# Patient Record
Sex: Male | Born: 1955 | Race: White | Hispanic: No | Marital: Married | Smoking: Former smoker
Health system: Southern US, Community
[De-identification: ages and names within clinical notes are randomized; demographics above are authoritative.]

## PROBLEM LIST (undated history)

## (undated) DIAGNOSIS — I499 Cardiac arrhythmia, unspecified: Secondary | ICD-10-CM

## (undated) DIAGNOSIS — C4491 Basal cell carcinoma of skin, unspecified: Secondary | ICD-10-CM

## (undated) DIAGNOSIS — K219 Gastro-esophageal reflux disease without esophagitis: Secondary | ICD-10-CM

## (undated) DIAGNOSIS — Z87442 Personal history of urinary calculi: Secondary | ICD-10-CM

## (undated) DIAGNOSIS — M1990 Unspecified osteoarthritis, unspecified site: Secondary | ICD-10-CM

## (undated) DIAGNOSIS — L409 Psoriasis, unspecified: Secondary | ICD-10-CM

## (undated) HISTORY — PX: SKIN CANCER EXCISION: SHX779

## (undated) HISTORY — PX: KNEE SURGERY: SHX244

## (undated) HISTORY — PX: WRIST SURGERY: SHX841

## (undated) HISTORY — PX: HERNIA REPAIR: SHX51

## (undated) HISTORY — DX: Cardiac arrhythmia, unspecified: I49.9

---

## 2006-07-08 ENCOUNTER — Encounter: Admission: RE | Admit: 2006-07-08 | Discharge: 2006-07-08 | Payer: Self-pay | Admitting: Internal Medicine

## 2006-08-18 ENCOUNTER — Encounter: Admission: RE | Admit: 2006-08-18 | Discharge: 2006-08-18 | Payer: Self-pay | Admitting: Gastroenterology

## 2007-06-20 IMAGING — CT CT PELVIS W/ CM
2 of 5 series · 17 of 46 positions shown, 19 images · IV contrast (READICAT/WATER & [ID] OMNI 300)
Comparison: None.

ABDOMEN CT WITH CONTRAST:

CLINICAL DATA: Lower abdominal pain and diarrhea.
TECHNIQUE: Multidetector CT imaging of the abdomen and pelvis was performed
following the standard protocol during bolus administration of intravenous
contrast.

Contrast:  125 cc Omnipaque 300

[Series 3: routine abdomen · axial · 0.70mm/px · z∈[-400,-40]mm · 14 of 81 slices shown, 16 images]
[im 5/81  soft-tissue]
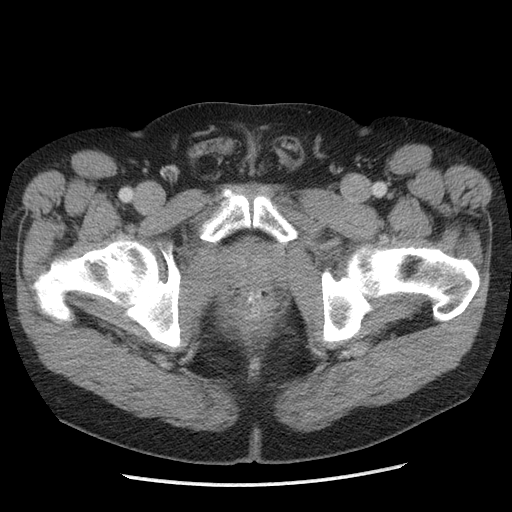
[im 5/81  bone]
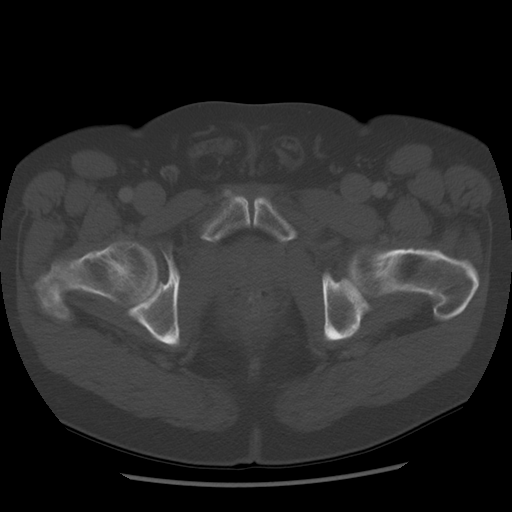
[im 9/81  soft-tissue]
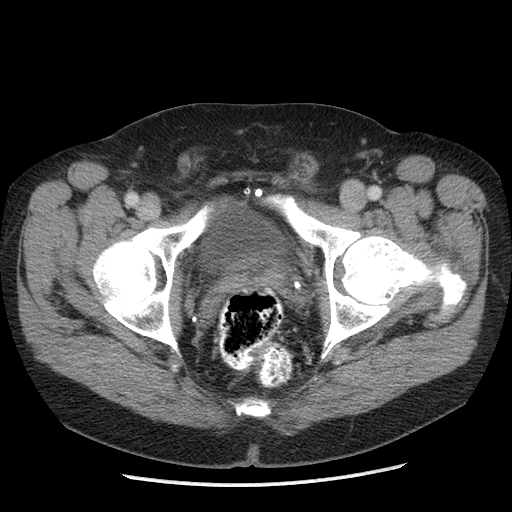
[im 17/81  soft-tissue]
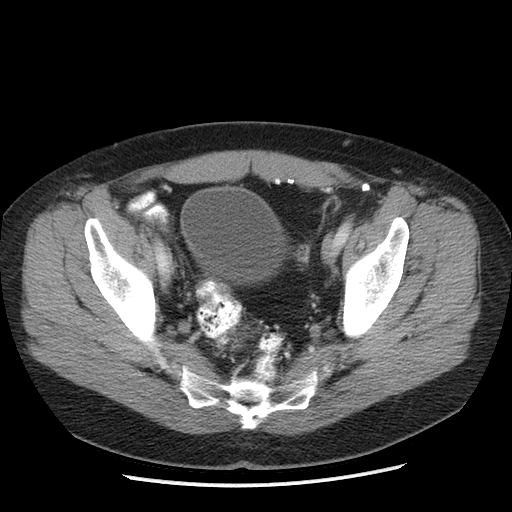
[im 22/81  soft-tissue]
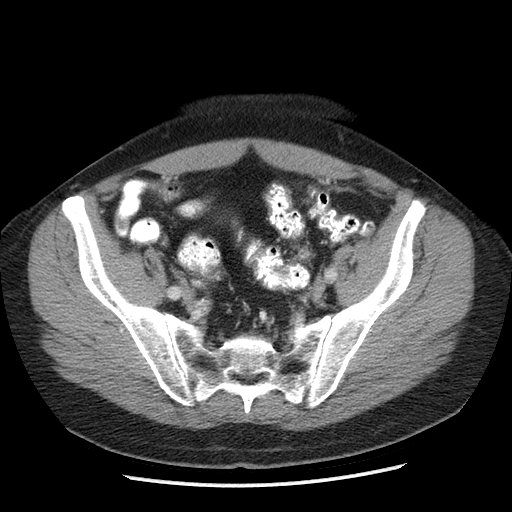
[im 26/81  soft-tissue]
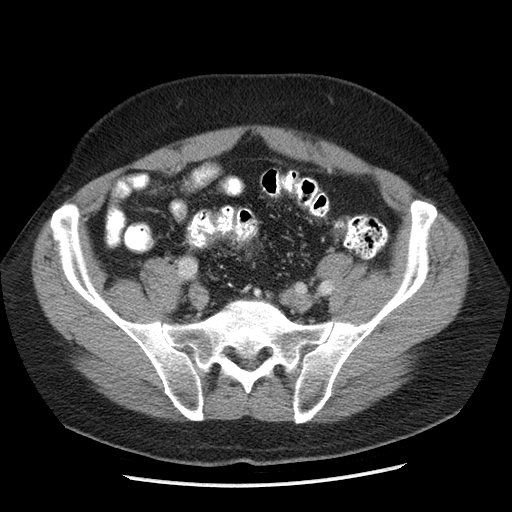
[im 34/81  soft-tissue]
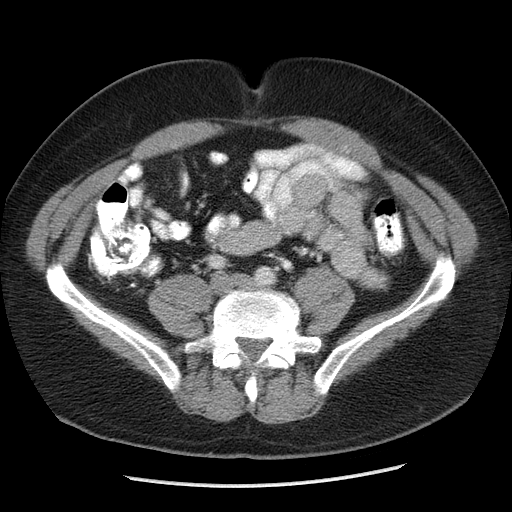
[im 38/81  soft-tissue]
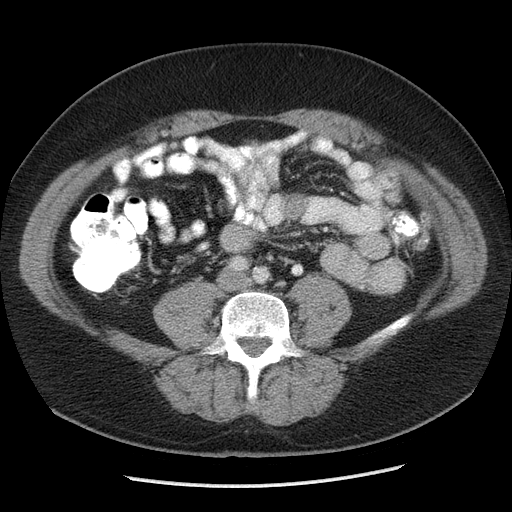
[im 43/81  soft-tissue]
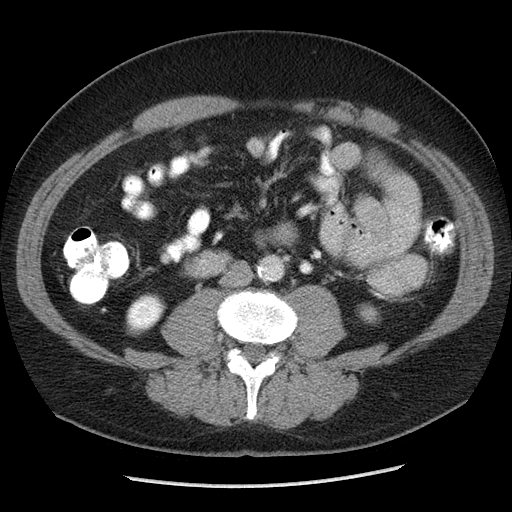
[im 47/81  soft-tissue]
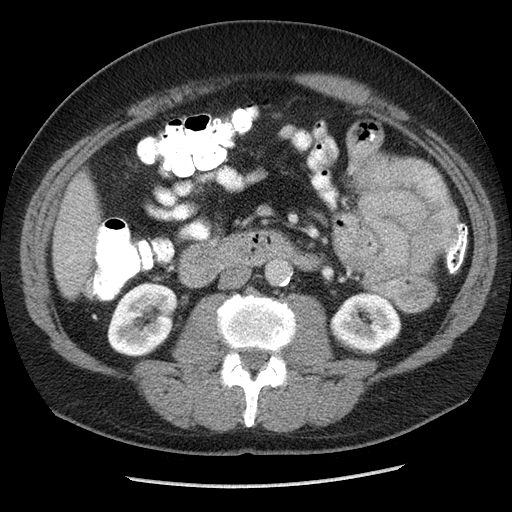
[im 47/81  bone]
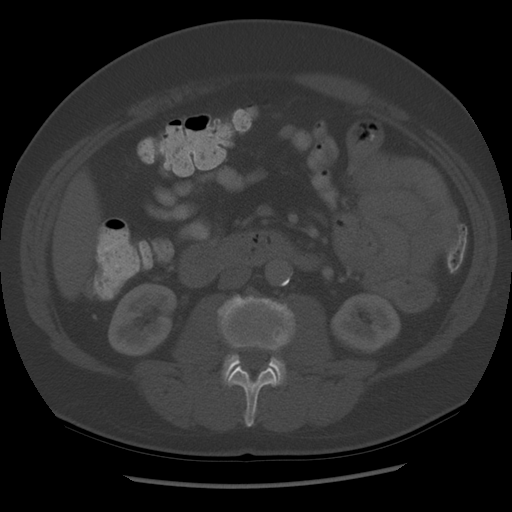
[im 55/81  soft-tissue]
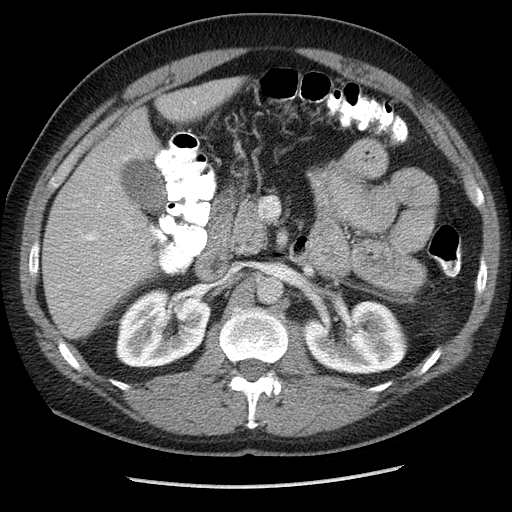
[im 59/81  soft-tissue]
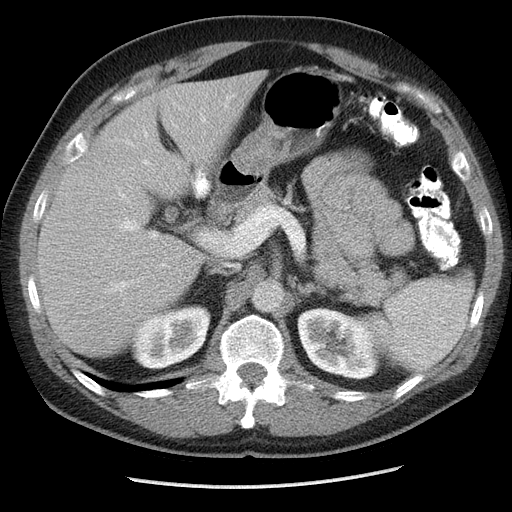
[im 64/81  soft-tissue]
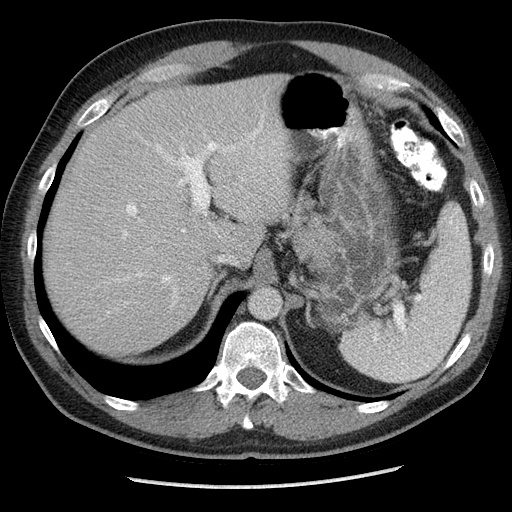
[im 72/81  soft-tissue]
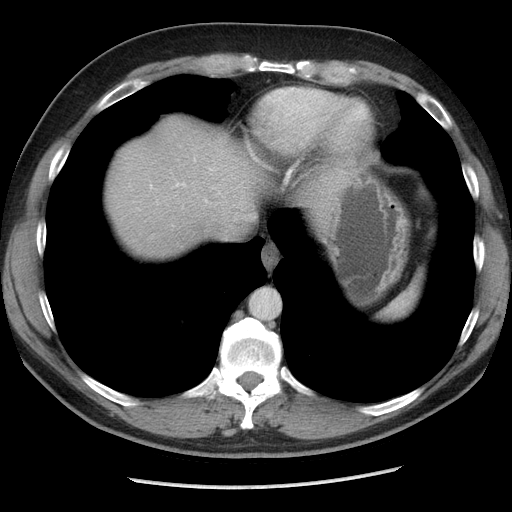
[im 76/81  soft-tissue]
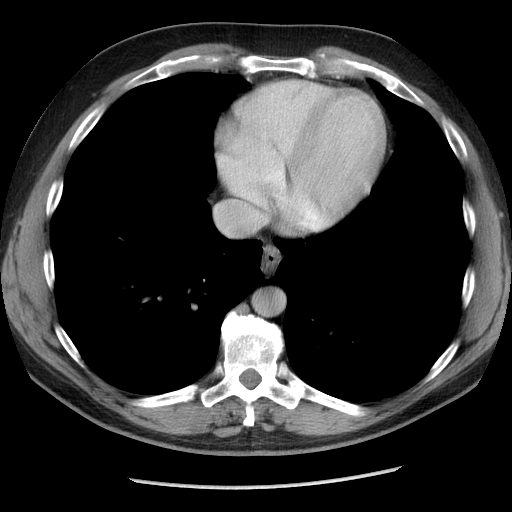

[Series 602: sagittal body · sagittal · 0.88mm/px · 3 of 145 slices shown]
[im 49/145  soft-tissue]
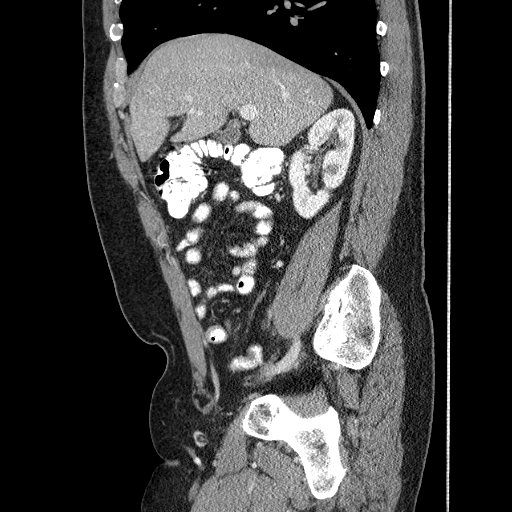
[im 65/145  soft-tissue]
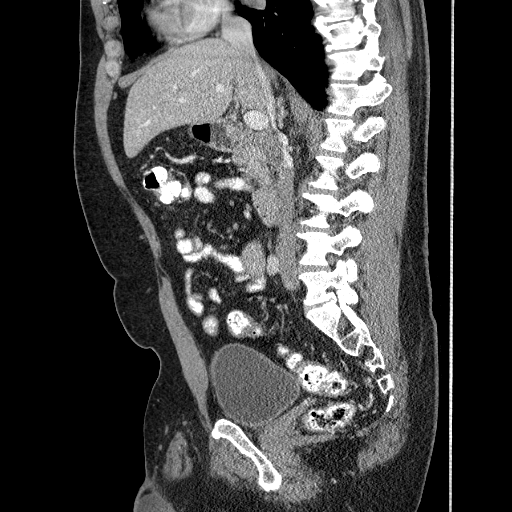
[im 81/145  soft-tissue]
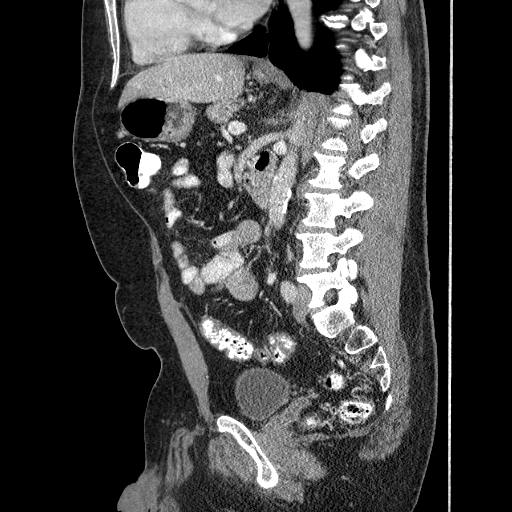

[17 of 46 positions shown; findings below may reference images not displayed]

FINDINGS: The liver, spleen, stomach, duodenum, pancreas, gallbladder, adrenal
glands, and kidneys are unremarkable. No intraperitoneal free fluid. No
abdominal lymphadenopathy. No abdominal aortic aneurysm.
IMPRESSION: No acute findings in the abdomen.

PELVIS CT WITH CONTRAST:
FINDINGS: No evidence for lymphadenopathy or intraperitoneal free fluid.
Patient is status post mesh placement in the left inguinal region. No evidence
for hiatal hernia. The bladder is not distended. Terminal ileum is unremarkable.
The appendix is unremarkable. No evidence for diverticulitis or colonic wall
thickening. No fluid identified within the colon to suggest ongoing diarrhea.
IMPRESSION: No acute findings in the anatomic pelvis.

## 2007-07-03 ENCOUNTER — Ambulatory Visit: Payer: Self-pay | Admitting: Family Medicine

## 2008-09-15 ENCOUNTER — Ambulatory Visit: Payer: Self-pay | Admitting: Family Medicine

## 2014-12-12 ENCOUNTER — Other Ambulatory Visit: Payer: Self-pay | Admitting: Family Medicine

## 2014-12-12 DIAGNOSIS — M7989 Other specified soft tissue disorders: Secondary | ICD-10-CM

## 2014-12-14 ENCOUNTER — Ambulatory Visit
Admission: RE | Admit: 2014-12-14 | Discharge: 2014-12-14 | Disposition: A | Discharge status: Home / Self Care | Payer: BC Managed Care – PPO | Source: Ambulatory Visit | Attending: Family Medicine | Admitting: Family Medicine

## 2014-12-14 DIAGNOSIS — M7989 Other specified soft tissue disorders: Secondary | ICD-10-CM

## 2017-05-09 ENCOUNTER — Ambulatory Visit: Payer: BC Managed Care – PPO | Admitting: Family Medicine

## 2021-06-07 ENCOUNTER — Other Ambulatory Visit: Payer: Self-pay

## 2021-06-07 ENCOUNTER — Ambulatory Visit: Payer: Medicare Other | Admitting: Cardiology

## 2021-06-07 ENCOUNTER — Encounter: Payer: Self-pay | Admitting: Cardiology

## 2021-06-07 VITALS — BP 124/70 | HR 62 | Temp 98.0°F | Resp 14 | Ht 72.0 in | Wt 239.0 lb

## 2021-06-07 DIAGNOSIS — Z6832 Body mass index (BMI) 32.0-32.9, adult: Secondary | ICD-10-CM

## 2021-06-07 DIAGNOSIS — E6609 Other obesity due to excess calories: Secondary | ICD-10-CM

## 2021-06-07 DIAGNOSIS — I4821 Permanent atrial fibrillation: Secondary | ICD-10-CM

## 2021-06-07 NOTE — Progress Notes (Signed)
Primary Physician/Referring:  Chesley Noon, MD  Patient ID: Mathew Daugherty, male    DOB: 27-Sep-1955, 66 y.o.   MRN: [id number]  Chief Complaint  Patient presents with   Atrial Fibrillation   New Patient (Initial Visit)    Referred by Anastasia Pall, MD   HPI:    Mathew Daugherty  is a 66 y.o. Caucasian male patient with atrial fibrillation diagnosed during routine physical examination in 2021, has had echocardiogram and also Holter monitor for 48 hours which revealed persistent atrial fibrillation.  He is referred to me for management of A-fib.  He is essentially asymptomatic.   Past Medical History:  Diagnosis Date   Arrhythmia    History reviewed. No pertinent surgical history. Family History  Problem Relation Age of Onset   Stroke Mother 35   Leukemia Father 53   Heart failure Sister        Died from complications of blood clots   Hyperlipidemia Sister    Other Proofreader accident    Social History   Tobacco Use   Smoking status: Former    Packs/day: 0.50    Years: 15.00    Pack years: 7.50    Types: Cigarettes    Quit date: 1998    Years since quitting: 25.1   Smokeless tobacco: Never  Substance Use Topics   Alcohol use: Yes    Alcohol/week: 1.0 standard drink    Types: 1 Glasses of wine per week    Comment: occasionally   Marital Status: Married  ROS  Review of Systems  Cardiovascular:  Negative for chest pain, dyspnea on exertion and leg swelling.  Objective  Blood pressure 124/70, pulse 62, temperature 98 F (36.7 C), temperature source Temporal, resp. rate 14, height 6' (1.829 m), weight 239 lb (108.4 kg), SpO2 98 %. Body mass index is 32.41 kg/m.  Vitals with BMI 06/07/2021  Height 6' 0"   Weight 239 lbs  BMI 46.56  Systolic 812  Diastolic 70  Pulse 62    Physical Exam Constitutional:      Appearance: He is obese.  Neck:     Vascular: No carotid bruit or JVD.  Cardiovascular:     Rate and Rhythm: Normal rate. Rhythm irregular.      Pulses: Normal pulses and intact distal pulses.     Heart sounds: No murmur heard.   No gallop. No S3 or S4 sounds.  Pulmonary:     Effort: Pulmonary effort is normal.     Breath sounds: Normal breath sounds.  Abdominal:     General: Bowel sounds are normal.     Palpations: Abdomen is soft.  Musculoskeletal:     Right lower leg: No edema.     Left lower leg: No edema.  Skin:    Capillary Refill: Capillary refill takes less than 2 seconds.     Medications and allergies  No Known Allergies   Medication list after today's encounter    Current Outpatient Medications:    Cholecalciferol 25 MCG (1000 UT) tablet, Take 1 tablet by mouth daily., Disp: , Rfl:    diclofenac Sodium (VOLTAREN) 1 % GEL, Place onto the skin., Disp: , Rfl:    hydrocortisone 2.5 % lotion, Apply topically daily., Disp: , Rfl:    sildenafil (REVATIO) 20 MG tablet, 1-3 tabs daily prn ED, Disp: , Rfl:    Tapinarof (VTAMA) 1 % CREA, Apply topically., Disp: , Rfl:    triamcinolone cream (KENALOG) 0.1 %,  Apply 1-2 application topically as needed., Disp: , Rfl:   Current Outpatient Medications  Medication Instructions   Cholecalciferol 25 MCG (1000 UT) tablet 1 tablet, Oral, Daily   diclofenac Sodium (VOLTAREN) 1 % GEL Transdermal   hydrocortisone 2.5 % lotion Topical, Daily   sildenafil (REVATIO) 20 MG tablet 1-3 tabs daily prn ED   Tapinarof (VTAMA) 1 % CREA Topical   triamcinolone cream (KENALOG) 0.1 % 1-2 application, Topical, As needed    Laboratory examination:   TSH No results for input(s): TSH in the last 8760 hours.  External labs:   Labs 05/18/2021:  Total cholesterol 141, triglycerides 68, HDL 40, LDL 87.  Vitamin D markedly elevated at 127.  Hb 15.9/HCT 46.4, platelets 148, normal indicis.  Serum glucose 1 1 2  mg, BUN 13, creatinine 1.03, EGFR 80 mL, potassium 4.6, LFTs normal.    Radiology:     Cardiac Studies:   Holter monitor for 48 hours 02/10/2020, external source: Patient  monitored for 48 hours.  Analysis and rhythm strips revealed persistent atrial fibrillation at an average rate of 68 bpm.  Atrial fibrillation burden 100%.  Minimum heart rate 41 bpm.  Maximum heart rate 137 bpm.  Rare premature ventricular contractions.  No complex ventricular ectopy.  There was one 3-second pause noted.  Echocardiogram 02/15/2020:  Normal LV size scratch that Left ventricle is normal in size, mild LVH.  Indeterminate diastolic function.  EF 55 to 60%. Left atrium is mildly dilated. No significant valvular abnormality.  EKG:   EKG 06/07/2021: Atrial fibrillation with controlled ventricular response at rate of 68 bpm, normal axis, incomplete right bundle branch block.  No evidence of ischemia.    Assessment     ICD-10-CM   1. Permanent atrial fibrillation (HCC)  I48.21 EKG 12-Lead    TSH + free T4    PCV ECHOCARDIOGRAM COMPLETE    2. Class 1 obesity due to excess calories without serious comorbidity with body mass index (BMI) of 32.0 to 32.9 in adult  E66.09 TSH + free T4   Z68.32        Medications Discontinued During This Encounter  Medication Reason   aspirin 81 MG EC tablet Discontinued by provider    No orders of the defined types were placed in this encounter.  Orders Placed This Encounter  Procedures   TSH + free T4   EKG 12-Lead   PCV ECHOCARDIOGRAM COMPLETE    Standing Status:   Future    Standing Expiration Date:   06/07/2022   Recommendations:   Mathew Daugherty is a 66 y.o. Caucasian male patient with atrial fibrillation diagnosed during routine physical examination in 2021, has had echocardiogram and also Holter monitor for 48 hours which revealed persistent atrial fibrillation.  He is referred to me for management of A-fib.  He is essentially asymptomatic.  No symptoms to suggest sleep apnea, no significant change in his weight and he and his husband are trying to make lifestyle changes and are losing weight.  I reviewed the cardiology  consultation in 2021, external source, at that time there was attempted TEE cardioversion and a stress test was to be set up however patient canceled the test.  As he is completely asymptomatic, no clinical evidence of heart failure, would not recommend rhythm management as he does not have any significant cardiovascular risks except obesity.  His heart rate is also well controlled.  However I would repeat echocardiogram to evaluate his LV systolic function and unless this is abnormal, I  will see him back on a as needed basis.  His CHA2DS2-VASc risk is only 1.  Advised him to discontinue aspirin as there is no benefit.  I will check TSH.    Adrian Prows, MD, [facility] 06/07/2021, 11:53 AM Office: [phone]

## 2021-06-08 LAB — TSH+FREE T4
Free T4: 1.04 ng/dL (ref 0.82–1.77)
TSH: 1.47 u[IU]/mL (ref 0.450–4.500)

## 2021-06-12 ENCOUNTER — Other Ambulatory Visit: Payer: Self-pay

## 2021-06-12 ENCOUNTER — Ambulatory Visit: Payer: Medicare Other

## 2021-06-12 DIAGNOSIS — I4821 Permanent atrial fibrillation: Secondary | ICD-10-CM

## 2022-04-30 ENCOUNTER — Ambulatory Visit: Admit: 2022-04-30 | Payer: BC Managed Care – PPO | Admitting: Surgery

## 2022-04-30 SURGERY — REPAIR, HERNIA, VENTRAL, LAPAROSCOPIC
Anesthesia: General

## 2022-05-09 ENCOUNTER — Other Ambulatory Visit: Payer: Self-pay

## 2022-05-09 ENCOUNTER — Encounter (HOSPITAL_COMMUNITY): Payer: Self-pay | Admitting: Surgery

## 2022-05-09 NOTE — Progress Notes (Signed)
Surgery orders requested via Epic inbox. °

## 2022-05-09 NOTE — Progress Notes (Addendum)
COVID Vaccine Completed:  Yes  Date of COVID positive in last 90 days:  No  PCP - Anastasia Pall, MD Cardiologist -  Adrian Prows, MD  Chest x-ray - N/A EKG - 06-07-21 Epic Stress Test - N/A ECHO - 06-12-21 Epic Cardiac Cath - N/A Pacemaker/ICD device last checked: Spinal Cord Stimulator: N/A  Bowel Prep - N/A  Sleep Study - N/A CPAP -   Fasting Blood Sugar - N/A Checks Blood Sugar _____ times a day  Last dose of GLP1 agonist-  N/A GLP1 instructions:  N/A   Last dose of SGLT-2 inhibitors-  N/A SGLT-2 instructions: N/A  Blood Thinner Instructions: Aspirin Instructions:  ASA 81  Last Dose:  05-06-22  Activity level:  Can go up a flight of stairs and perform activities of daily living without stopping and without symptoms of chest pain or shortness of breath.  Able to exercise without symptoms  Anesthesia review:  Permanent Afib, asymptomatic  Patient denies shortness of breath, fever, cough and chest pain at PAT appointment  Patient verbalized understanding of instructions that were given to them at the PAT appointment. Patient was also instructed that they will need to review over the PAT instructions again at home before surgery.

## 2022-05-13 NOTE — H&P (Signed)
REFERRING PHYSICIAN: Lucila Maine*  PROVIDER: Joya San, MD  MRN: [id number] DOB: [birth year]   Chief Complaint: ventral hernia   History of Present Illness: Mathew Daugherty is a 67 y.o. male who is seen today as an office consultation at the request of Dr. Melford Aase for evaluation of hernia.  Recently he has had pain around his umbilicus. He has a palpable rubbery mass above the umbilicus consistent with a ventral hernia. 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 They do all this out at [facility]  The pain is gotten bad when he is working and so he wants to get this repaired. I would approach this laparoscopically and then cut down on it and repair it with mesh.  He had a previous inguinal hernia by one of our partners in around 2007.  Review of Systems: See HPI as well for other ROS.  ROS  Medical History: Past Medical History: Diagnosis Date Arrhythmia  Patient Active Problem List Diagnosis Abnormal ECG Basal cell carcinoma (BCC) of skin of left lower extremity including hip Class 1 obesity in adult Paroxysmal atrial fibrillation (CMS-HCC) Persistent atrial fibrillation (CMS-HCC) Primary osteoarthritis involving multiple joints Psoriasis  Past Surgical History: Procedure Laterality Date knee meniscus 2004 HERNIA REPAIR 2007   Allergies Allergen Reactions Codeine Other (See Comments) Mood swings  Current Outpatient Medications on File Prior to Visit Medication Sig Dispense Refill aspirin 81 MG EC tablet Take by mouth cholecalciferol (VITAMIN D3) 1000 unit capsule Take by mouth  No current facility-administered medications on file prior to visit.  Family History Problem Relation Age of Onset Breast cancer Mother Stroke Mother Deep vein thrombosis (DVT or abnormal blood clot formation) Father Leukemia Father Obesity Sister High blood pressure (Hypertension) Sister Hyperlipidemia  (Elevated cholesterol) Sister Heart failure Sister High blood pressure (Hypertension) Brother Obesity Brother   Social History  Tobacco Use Smoking Status Former Types: Cigarettes Quit date: 2001 Years since quitting: 22.9 Smokeless Tobacco Never   Social History  Socioeconomic History Marital status: Married Tobacco Use Smoking status: Former Types: Cigarettes Quit date: 2001 Years since quitting: 22.9 Smokeless tobacco: Never Substance and Sexual Activity Alcohol use: Yes Drug use: Never  Objective:  Vitals: BP: 100/70 Pulse: 77 Temp: 36.2 C (97.2 F) SpO2: 97% Weight: (!) 102.8 kg (226 lb 9.6 oz) Height: 182.9 cm (6')  Body mass index is 30.73 kg/m.  Physical Exam General: Well-maintained appropriately sized white male no acute distress HEENT : Unremarkable Chest: Clear Heart: Sinus rhythm Breast: Not examined Abdomen: Tender area above the umbilicus consistent with a midline ventral hernia with some incarcerated fat GU not examined Rectal not performed Extremities full range of motion Neuro alert and orient x 3. Motor and sensory function grossly intact  Labs, Imaging and Diagnostic Testing: None to review  Assessment and Plan: Diagnoses and all orders for this visit:  Incarcerated ventral hernia    Appears to contain incarcerated properitoneal fat. He has had no symptoms of bowel obstruction. Physical exam is consistent with incarcerated fat. Make laparoscopic repair as mentioned above would be in order. Will schedule at his convenience under general anesthesia at [place], MD

## 2022-05-14 ENCOUNTER — Ambulatory Visit (HOSPITAL_COMMUNITY)
Admission: RE | Admit: 2022-05-14 | Discharge: 2022-05-14 | Disposition: A | Discharge status: Home / Self Care | Payer: PPO | Attending: Surgery | Admitting: Surgery

## 2022-05-14 ENCOUNTER — Encounter (HOSPITAL_COMMUNITY): Payer: Self-pay | Admitting: Surgery

## 2022-05-14 ENCOUNTER — Encounter (HOSPITAL_COMMUNITY)
Admission: RE | Disposition: A | Discharge status: Home / Self Care | Payer: Self-pay | Source: Home / Self Care | Attending: Surgery

## 2022-05-14 ENCOUNTER — Other Ambulatory Visit: Payer: Self-pay

## 2022-05-14 ENCOUNTER — Ambulatory Visit (HOSPITAL_COMMUNITY): Payer: PPO | Admitting: Physician Assistant

## 2022-05-14 ENCOUNTER — Ambulatory Visit (HOSPITAL_BASED_OUTPATIENT_CLINIC_OR_DEPARTMENT_OTHER): Payer: PPO | Admitting: Physician Assistant

## 2022-05-14 DIAGNOSIS — K436 Other and unspecified ventral hernia with obstruction, without gangrene: Secondary | ICD-10-CM | POA: Insufficient documentation

## 2022-05-14 DIAGNOSIS — I4891 Unspecified atrial fibrillation: Secondary | ICD-10-CM | POA: Diagnosis not present

## 2022-05-14 DIAGNOSIS — Z87891 Personal history of nicotine dependence: Secondary | ICD-10-CM

## 2022-05-14 DIAGNOSIS — K429 Umbilical hernia without obstruction or gangrene: Secondary | ICD-10-CM | POA: Diagnosis not present

## 2022-05-14 DIAGNOSIS — M1990 Unspecified osteoarthritis, unspecified site: Secondary | ICD-10-CM | POA: Diagnosis not present

## 2022-05-14 DIAGNOSIS — Z01818 Encounter for other preprocedural examination: Secondary | ICD-10-CM

## 2022-05-14 DIAGNOSIS — I2510 Atherosclerotic heart disease of native coronary artery without angina pectoris: Secondary | ICD-10-CM

## 2022-05-14 HISTORY — DX: Psoriasis, unspecified: L40.9

## 2022-05-14 HISTORY — DX: Personal history of urinary calculi: Z87.442

## 2022-05-14 HISTORY — DX: Gastro-esophageal reflux disease without esophagitis: K21.9

## 2022-05-14 HISTORY — PX: VENTRAL HERNIA REPAIR: SHX424

## 2022-05-14 HISTORY — DX: Unspecified osteoarthritis, unspecified site: M19.90

## 2022-05-14 HISTORY — DX: Basal cell carcinoma of skin, unspecified: C44.91

## 2022-05-14 LAB — CBC
HCT: 46.6 % (ref 39.0–52.0)
Hemoglobin: 15.9 g/dL (ref 13.0–17.0)
MCH: 32.1 pg (ref 26.0–34.0)
MCHC: 34.1 g/dL (ref 30.0–36.0)
MCV: 94 fL (ref 80.0–100.0)
Platelets: 182 10*3/uL (ref 150–400)
RBC: 4.96 MIL/uL (ref 4.22–5.81)
RDW: 13 % (ref 11.5–15.5)
WBC: 7 10*3/uL (ref 4.0–10.5)
nRBC: 0 % (ref 0.0–0.2)

## 2022-05-14 LAB — BASIC METABOLIC PANEL
Anion gap: 11 (ref 5–15)
BUN: 13 mg/dL (ref 8–23)
CO2: 22 mmol/L (ref 22–32)
Calcium: 9.1 mg/dL (ref 8.9–10.3)
Chloride: 106 mmol/L (ref 98–111)
Creatinine, Ser: 1.07 mg/dL (ref 0.61–1.24)
GFR, Estimated: >60 mL/min (ref >60)
Glucose, Bld: 115 mg/dL — ABNORMAL HIGH (ref 70–99)
Potassium: 4.5 mmol/L (ref 3.5–5.1)
Sodium: 139 mmol/L (ref 135–145)

## 2022-05-14 SURGERY — REPAIR, HERNIA, VENTRAL, LAPAROSCOPIC
Anesthesia: General

## 2022-05-14 MED ORDER — ONDANSETRON HCL 4 MG/2ML IJ SOLN
INTRAMUSCULAR | Status: AC
  Filled 2022-05-14: qty 2

## 2022-05-14 MED ORDER — ROCURONIUM BROMIDE 10 MG/ML (PF) SYRINGE
PREFILLED_SYRINGE | INTRAVENOUS | Status: AC
  Filled 2022-05-14: qty 10

## 2022-05-14 MED ORDER — ROCURONIUM BROMIDE 100 MG/10ML IV SOLN
INTRAVENOUS | Status: DC | PRN
  Administered 2022-05-14: 60 mg via INTRAVENOUS

## 2022-05-14 MED ORDER — SODIUM CHLORIDE (PF) 0.9 % IJ SOLN
INTRAMUSCULAR | Status: DC | PRN
  Administered 2022-05-14: 10 mL via INTRAVENOUS

## 2022-05-14 MED ORDER — LIDOCAINE HCL (PF) 2 % IJ SOLN
INTRAMUSCULAR | Status: AC
  Filled 2022-05-14: qty 5

## 2022-05-14 MED ORDER — PROPOFOL 10 MG/ML IV BOLUS
INTRAVENOUS | Status: AC
  Filled 2022-05-14: qty 20

## 2022-05-14 MED ORDER — OXYCODONE HCL 5 MG PO TABS: 5.0000 mg | ORAL_TABLET | Freq: Once | ORAL | Status: DC | PRN

## 2022-05-14 MED ORDER — SUGAMMADEX SODIUM 200 MG/2ML IV SOLN
INTRAVENOUS | Status: DC | PRN
  Administered 2022-05-14: 200 mg via INTRAVENOUS

## 2022-05-14 MED ORDER — BUPIVACAINE LIPOSOME 1.3 % IJ SUSP
INTRAMUSCULAR | Status: AC
  Filled 2022-05-14: qty 20

## 2022-05-14 MED ORDER — BUPIVACAINE LIPOSOME 1.3 % IJ SUSP
INTRAMUSCULAR | Status: DC | PRN
  Administered 2022-05-14: 20 mL

## 2022-05-14 MED ORDER — DEXMEDETOMIDINE HCL IN NACL 80 MCG/20ML IV SOLN
INTRAVENOUS | Status: DC | PRN
  Administered 2022-05-14 (×2): 8 ug via BUCCAL

## 2022-05-14 MED ORDER — MIDAZOLAM HCL 2 MG/2ML IJ SOLN
INTRAMUSCULAR | Status: AC
  Filled 2022-05-14: qty 2

## 2022-05-14 MED ORDER — CHLORHEXIDINE GLUCONATE 0.12 % MT SOLN
15.0000 mL | Freq: Once | OROMUCOSAL | Status: AC
  Administered 2022-05-14: 15 mL via OROMUCOSAL

## 2022-05-14 MED ORDER — OXYCODONE HCL 5 MG/5ML PO SOLN: 5.0000 mg | Freq: Once | ORAL | Status: DC | PRN

## 2022-05-14 MED ORDER — MIDAZOLAM HCL 5 MG/5ML IJ SOLN
INTRAMUSCULAR | Status: DC | PRN
  Administered 2022-05-14: 2 mg via INTRAVENOUS

## 2022-05-14 MED ORDER — DEXAMETHASONE SODIUM PHOSPHATE 10 MG/ML IJ SOLN
INTRAMUSCULAR | Status: AC
  Filled 2022-05-14: qty 1

## 2022-05-14 MED ORDER — PHENYLEPHRINE 80 MCG/ML (10ML) SYRINGE FOR IV PUSH (FOR BLOOD PRESSURE SUPPORT)
PREFILLED_SYRINGE | INTRAVENOUS | Status: DC | PRN
  Administered 2022-05-14: 160 ug via INTRAVENOUS

## 2022-05-14 MED ORDER — SCOPOLAMINE 1 MG/3DAYS TD PT72
1.0000 | MEDICATED_PATCH | TRANSDERMAL | Status: DC
  Administered 2022-05-14: 1.5 mg via TRANSDERMAL
  Filled 2022-05-14: qty 1

## 2022-05-14 MED ORDER — FENTANYL CITRATE (PF) 100 MCG/2ML IJ SOLN
INTRAMUSCULAR | Status: AC
  Filled 2022-05-14: qty 2

## 2022-05-14 MED ORDER — ONDANSETRON HCL 4 MG/2ML IJ SOLN: 4.0000 mg | Freq: Four times a day (QID) | INTRAMUSCULAR | Status: DC | PRN

## 2022-05-14 MED ORDER — FENTANYL CITRATE PF 50 MCG/ML IJ SOSY: 25.0000 ug | PREFILLED_SYRINGE | INTRAMUSCULAR | Status: DC | PRN

## 2022-05-14 MED ORDER — CEFAZOLIN SODIUM-DEXTROSE 2-4 GM/100ML-% IV SOLN
2.0000 g | INTRAVENOUS | Status: AC
  Administered 2022-05-14: 2 g via INTRAVENOUS
  Filled 2022-05-14: qty 100

## 2022-05-14 MED ORDER — LIDOCAINE HCL (CARDIAC) PF 100 MG/5ML IV SOSY
PREFILLED_SYRINGE | INTRAVENOUS | Status: DC | PRN
  Administered 2022-05-14: 60 mg via INTRAVENOUS

## 2022-05-14 MED ORDER — DEXAMETHASONE SODIUM PHOSPHATE 10 MG/ML IJ SOLN
INTRAMUSCULAR | Status: DC | PRN
  Administered 2022-05-14: 8 mg via INTRAVENOUS

## 2022-05-14 MED ORDER — SODIUM CHLORIDE (PF) 0.9 % IJ SOLN
INTRAMUSCULAR | Status: AC
  Filled 2022-05-14: qty 10

## 2022-05-14 MED ORDER — FENTANYL CITRATE (PF) 100 MCG/2ML IJ SOLN
INTRAMUSCULAR | Status: DC | PRN
  Administered 2022-05-14: 100 ug via INTRAVENOUS

## 2022-05-14 MED ORDER — ONDANSETRON HCL 4 MG/2ML IJ SOLN
INTRAMUSCULAR | Status: DC | PRN
  Administered 2022-05-14: 4 mg via INTRAVENOUS

## 2022-05-14 MED ORDER — 0.9 % SODIUM CHLORIDE (POUR BTL) OPTIME
TOPICAL | Status: DC | PRN
  Administered 2022-05-14: 1000 mL

## 2022-05-14 MED ORDER — ORAL CARE MOUTH RINSE: 15.0000 mL | Freq: Once | OROMUCOSAL | Status: AC

## 2022-05-14 MED ORDER — PROPOFOL 10 MG/ML IV BOLUS
INTRAVENOUS | Status: DC | PRN
  Administered 2022-05-14: 200 mg via INTRAVENOUS

## 2022-05-14 MED ORDER — PHENYLEPHRINE 80 MCG/ML (10ML) SYRINGE FOR IV PUSH (FOR BLOOD PRESSURE SUPPORT)
PREFILLED_SYRINGE | INTRAVENOUS | Status: AC
  Filled 2022-05-14: qty 10

## 2022-05-14 MED ORDER — HYDROCODONE-ACETAMINOPHEN 5-325 MG PO TABS: 1.0000 | ORAL_TABLET | Freq: Four times a day (QID) | ORAL | 0 refills | Status: AC | PRN

## 2022-05-14 MED ORDER — CHLORHEXIDINE GLUCONATE CLOTH 2 % EX PADS: 6.0000 | MEDICATED_PAD | Freq: Once | CUTANEOUS | Status: DC

## 2022-05-14 MED ORDER — LACTATED RINGERS IV SOLN: INTRAVENOUS | Status: DC

## 2022-05-14 SURGICAL SUPPLY — 53 items
ADH SKN CLS APL DERMABOND .7 (GAUZE/BANDAGES/DRESSINGS) ×1
APL PRP STRL LF DISP 70% ISPRP (MISCELLANEOUS) ×1
BAG COUNTER SPONGE SURGICOUNT (BAG) IMPLANT
BAG SPNG CNTER NS LX DISP (BAG)
BALL CTTN STRL GZE (GAUZE/BANDAGES/DRESSINGS) ×1
BINDER ABDOMINAL 12 ML 46-62 (SOFTGOODS) IMPLANT
CABLE HIGH FREQUENCY MONO STRZ (ELECTRODE) IMPLANT
CHLORAPREP W/TINT 26 (MISCELLANEOUS) ×1 IMPLANT
COTTON BALL STERILE (GAUZE/BANDAGES/DRESSINGS) ×1
COTTON BALL STERILE 2 PK (GAUZE/BANDAGES/DRESSINGS) IMPLANT
COVER SURGICAL LIGHT HANDLE (MISCELLANEOUS) ×1 IMPLANT
DERMABOND ADVANCED .7 DNX12 (GAUZE/BANDAGES/DRESSINGS) ×1 IMPLANT
DEVICE SECURE STRAP 25 ABSORB (INSTRUMENTS) IMPLANT
DEVICE TROCAR PUNCTURE CLOSURE (ENDOMECHANICALS) ×1 IMPLANT
DISSECTOR BLUNT TIP ENDO 5MM (MISCELLANEOUS) IMPLANT
DRSG TEGADERM 4X4.75 (GAUZE/BANDAGES/DRESSINGS) IMPLANT
ELECT L-HOOK LAP 45CM DISP (ELECTROSURGICAL) ×1
ELECT PENCIL ROCKER SW 15FT (MISCELLANEOUS) ×1 IMPLANT
ELECT REM PT RETURN 15FT ADLT (MISCELLANEOUS) ×1 IMPLANT
ELECTRODE L-HOOK LAP 45CM DISP (ELECTROSURGICAL) IMPLANT
GLOVE SURG LX STRL 8.0 MICRO (GLOVE) ×1 IMPLANT
GOWN STRL REUS W/ TWL XL LVL3 (GOWN DISPOSABLE) ×3 IMPLANT
GOWN STRL REUS W/TWL XL LVL3 (GOWN DISPOSABLE) ×3
IRRIG SUCT STRYKERFLOW 2 WTIP (MISCELLANEOUS)
IRRIGATION SUCT STRKRFLW 2 WTP (MISCELLANEOUS) IMPLANT
KIT BASIN OR (CUSTOM PROCEDURE TRAY) ×1 IMPLANT
KIT TURNOVER KIT A (KITS) IMPLANT
MARKER SKIN DUAL TIP RULER LAB (MISCELLANEOUS) ×1 IMPLANT
MESH VENTRALEX ST 1-7/10 CRC S (Mesh General) IMPLANT
NDL SPNL 22GX3.5 QUINCKE BK (NEEDLE) ×1 IMPLANT
NEEDLE SPNL 22GX3.5 QUINCKE BK (NEEDLE) ×1 IMPLANT
SCISSORS LAP 5X45 EPIX DISP (ENDOMECHANICALS) ×1 IMPLANT
SCRUB TECHNI CARE 4 OZ NO DYE (MISCELLANEOUS) ×1 IMPLANT
SET TUBE SMOKE EVAC HIGH FLOW (TUBING) ×1 IMPLANT
SHEARS HARMONIC ACE PLUS 45CM (MISCELLANEOUS) IMPLANT
SLEEVE ADV FIXATION 5X100MM (TROCAR) IMPLANT
SLEEVE Z-THREAD 5X100MM (TROCAR) IMPLANT
SPIKE FLUID TRANSFER (MISCELLANEOUS) ×1 IMPLANT
STAPLER VISISTAT 35W (STAPLE) IMPLANT
SUT MNCRL AB 4-0 PS2 18 (SUTURE) ×1 IMPLANT
SUT NOVA NAB DX-16 0-1 5-0 T12 (SUTURE) ×1 IMPLANT
SUT VIC AB 4-0 SH 18 (SUTURE) IMPLANT
SYR 20ML ECCENTRIC (SYRINGE) IMPLANT
TACKER 5MM HERNIA 3.5CML NAB (ENDOMECHANICALS) IMPLANT
TOWEL OR 17X26 10 PK STRL BLUE (TOWEL DISPOSABLE) ×1 IMPLANT
TOWEL OR NON WOVEN STRL DISP B (DISPOSABLE) ×1 IMPLANT
TRAY FOLEY MTR SLVR 16FR STAT (SET/KITS/TRAYS/PACK) IMPLANT
TRAY LAPAROSCOPIC (CUSTOM PROCEDURE TRAY) ×1 IMPLANT
TROCAR 11X100 Z THREAD (TROCAR) IMPLANT
TROCAR ADV FIXATION 5X100MM (TROCAR) IMPLANT
TROCAR OPTI TIP 5M 100M (ENDOMECHANICALS) ×1 IMPLANT
TROCAR Z-THREAD OPTICAL 5X100M (TROCAR) ×1 IMPLANT
YANKAUER SUCT BULB TIP NO VENT (SUCTIONS) ×1 IMPLANT

## 2022-05-14 NOTE — Transfer of Care (Signed)
Immediate Anesthesia Transfer of Care Note  Patient: Mathew Daugherty  Procedure(s) Performed: LAPAROSCOPIC VENTRAL HERNIA REPAIR with mesh  Patient Location: PACU  Anesthesia Type:General  Level of Consciousness: drowsy  Airway & Oxygen Therapy: Patient Spontanous Breathing and Patient connected to face mask oxygen  Post-op Assessment: Report given to RN and Post -op Vital signs reviewed and stable  Post vital signs: Reviewed and stable  Last Vitals:  Vitals Value Taken Time  BP 117/84 05/14/22 1532  Temp    Pulse 66 05/14/22 1534  Resp 14 05/14/22 1534  SpO2 99 % 05/14/22 1534  Vitals shown include unvalidated device data.  Last Pain:  Vitals:   05/14/22 0906  TempSrc: Oral  PainSc: 0-No pain      Patients Stated Pain Goal: 3 ([id number])  Complications: No notable events documented.

## 2022-05-14 NOTE — Anesthesia Postprocedure Evaluation (Signed)
Anesthesia Post Note  Patient: Mathew Daugherty  Procedure(s) Performed: LAPAROSCOPIC VENTRAL HERNIA REPAIR with mesh     Patient location during evaluation: PACU Anesthesia Type: General Level of consciousness: awake and alert Pain management: pain level controlled Vital Signs Assessment: post-procedure vital signs reviewed and stable Respiratory status: spontaneous breathing, nonlabored ventilation, respiratory function stable and patient connected to nasal cannula oxygen Cardiovascular status: blood pressure returned to baseline and stable Postop Assessment: no apparent nausea or vomiting Anesthetic complications: no  No notable events documented.  Last Vitals:  Vitals:   05/14/22 1545 05/14/22 1600  BP: 117/75 112/77  Pulse: 68 72  Resp: 13 13  Temp:  36.8 C  SpO2: 95% 90%    Last Pain:  Vitals:   05/14/22 1600  TempSrc:   PainSc: 0-No pain   Pain Goal: Patients Stated Pain Goal: 3 (05/14/22 0906)                 Burleigh Brockmann L Yiselle Babich

## 2022-05-14 NOTE — Anesthesia Preprocedure Evaluation (Signed)
Anesthesia Evaluation  Patient identified by MRN, date of birth, ID band Patient awake    Reviewed: Allergy & Precautions, H&P , NPO status , Patient's Chart, lab work & pertinent test results  Airway Mallampati: II   Neck ROM: full    Dental   Pulmonary former smoker   breath sounds clear to auscultation       Cardiovascular + dysrhythmias Atrial Fibrillation  Rhythm:irregular Rate:Normal     Neuro/Psych    GI/Hepatic ,GERD  ,,  Endo/Other    Renal/GU stones     Musculoskeletal  (+) Arthritis ,    Abdominal   Peds  Hematology   Anesthesia Other Findings   Reproductive/Obstetrics                             Anesthesia Physical Anesthesia Plan  ASA: 3  Anesthesia Plan: General   Post-op Pain Management:    Induction: Intravenous  PONV Risk Score and Plan: 2 and Ondansetron, Dexamethasone, Midazolam and Treatment may vary due to age or medical condition  Airway Management Planned: Oral ETT  Additional Equipment:   Intra-op Plan:   Post-operative Plan: Extubation in OR  Informed Consent: I have reviewed the patients History and Physical, chart, labs and discussed the procedure including the risks, benefits and alternatives for the proposed anesthesia with the patient or authorized representative who has indicated his/her understanding and acceptance.     Dental advisory given  Plan Discussed with: CRNA, Anesthesiologist and Surgeon  Anesthesia Plan Comments:        Anesthesia Quick Evaluation

## 2022-05-14 NOTE — Interval H&P Note (Signed)
History and Physical Interval Note:  05/14/2022 9:16 AM  Mathew Daugherty  has presented today for surgery, with the diagnosis of [place].  The various methods of treatment have been discussed with the patient and family. After consideration of risks, benefits and other options for treatment, the patient has consented to  Procedure(s): [name] (N/A) as a surgical intervention.  The patient's history has been reviewed, patient examined, no change in status, stable for surgery.  I have reviewed the patient's chart and labs.  Questions were answered to the patient's satisfaction.     [name]

## 2022-05-14 NOTE — Anesthesia Procedure Notes (Signed)
Procedure Name: Intubation Date/Time: 05/14/2022 2:05 PM  Performed by: Hedda Slade, CRNAPre-anesthesia Checklist: Patient identified, Patient being monitored, Timeout performed, Emergency Drugs available and Suction available Patient Re-evaluated:Patient Re-evaluated prior to induction Oxygen Delivery Method: Circle system utilized Preoxygenation: Pre-oxygenation with 100% oxygen Induction Type: IV induction Ventilation: Mask ventilation without difficulty Laryngoscope Size: Mac and 4 Grade View: Grade I Tube type: Oral Tube size: 7.5 mm Number of attempts: 1 Airway Equipment and Method: Stylet Placement Confirmation: ETT inserted through vocal cords under direct vision, positive ETCO2 and breath sounds checked- equal and bilateral Secured at: 21 cm Tube secured with: Tape Dental Injury: Teeth and Oropharynx as per pre-operative assessment

## 2022-05-14 NOTE — Op Note (Signed)
Mathew Daugherty  Oct 12, 1955   05/14/2022    PCP:  Chesley Noon, MD   Surgeon: Kaylyn Lim, MD, FACS  Asst:  none  Anes:  general  Preop Dx: Umbilical hernia Postop Dx: same  Procedure: Laparoscopy, open umbilical hernia repair with 1.7" Ventralex mesh Location Surgery: [facility] 2 Complications: None noted  EBL:   minimal cc  Drains: none  Description of Procedure:  The patient was taken to OR 2 .  After anesthesia was administered and the patient was prepped  with chloroprep  and a timeout was performed.  Access to the abdomen was achieved with a 5 mm Optiview through the left upper quadrant.  Abdomen was insufflated and a tongue of omentum was found to be stuck up in this hernia.  After second 5 mm trocar was placed in the left lower quadrant this was retrieved without difficulty and did not appear to be adherent to the sac so it was not really incarcerated.  There was however a pretty generous sac that was creating an allergy in the umbilicus  A curvilinear incision was made in the infraumbilical infraumbilical region and the skin was was peeled off of this hernia sac.  It was freed up around the neck.  The actual defect was small and was about a centimeter in diameter.  We visualized out laparoscopically as I removed it.  Bleeding was controlled with electrocautery.  1.7 inch Ventralex mesh the smallest that is available was was popped into the abdomen and deployed.  It was sutured with 4 sutures of 0 Novafil.  It was then secured through one of the 5 mm port ports with the secure the secure strap.  Absorbable staples.  We were unable to take pictures because the camera would not do that but the tape was lying very nicely and it seemed to cover the small defect very well.  The remaining portion of the abdomen appeared to be fairly unremarkable in appearance.  The the incisions were closed around the umbilicus with 4-0 Vicryl in the deep layer.  I also injected 30 cc of Exparel  bilaterally as a tap block and also injected the port sites.  4-0 Monocryl was used to approximate the skin along with Dermabond.  The patient tolerated the procedure well and was taken to the PACU in stable condition.     [name] B. Hassell Done, MD, [facility], [place] [phone] none

## 2022-05-15 ENCOUNTER — Encounter (HOSPITAL_COMMUNITY): Payer: Self-pay | Admitting: Surgery

## 2023-07-29 ENCOUNTER — Other Ambulatory Visit (HOSPITAL_COMMUNITY): Payer: Self-pay | Admitting: Internal Medicine

## 2023-07-29 DIAGNOSIS — I4891 Unspecified atrial fibrillation: Secondary | ICD-10-CM

## 2023-07-30 ENCOUNTER — Ambulatory Visit (HOSPITAL_COMMUNITY): Attending: Internal Medicine

## 2023-07-30 DIAGNOSIS — I340 Nonrheumatic mitral (valve) insufficiency: Secondary | ICD-10-CM | POA: Diagnosis not present

## 2023-07-30 DIAGNOSIS — I4891 Unspecified atrial fibrillation: Secondary | ICD-10-CM

## 2023-07-30 LAB — ECHOCARDIOGRAM COMPLETE
MV M vel: 5.02 m/s
MV Peak grad: 100.8 mmHg
S' Lateral: 3.8 cm
